# Patient Record
Sex: Female | Born: 1970
Health system: Southern US, Community
[De-identification: ages and names within clinical notes are randomized; demographics above are authoritative.]

## PROBLEM LIST (undated history)

## (undated) DIAGNOSIS — K219 Gastro-esophageal reflux disease without esophagitis: Secondary | ICD-10-CM

---

## 2006-05-22 ENCOUNTER — Emergency Department: Payer: Self-pay | Admitting: Emergency Medicine

## 2008-09-21 ENCOUNTER — Ambulatory Visit: Payer: Self-pay | Admitting: Ophthalmology

## 2008-10-19 ENCOUNTER — Encounter: Payer: Self-pay | Admitting: Obstetrics & Gynecology

## 2008-10-26 ENCOUNTER — Encounter: Payer: Self-pay | Admitting: Obstetrics and Gynecology

## 2008-10-31 ENCOUNTER — Ambulatory Visit: Payer: Self-pay

## 2008-11-13 ENCOUNTER — Ambulatory Visit: Payer: Self-pay

## 2008-11-27 ENCOUNTER — Encounter: Payer: Self-pay | Admitting: Obstetrics & Gynecology

## 2008-12-25 ENCOUNTER — Encounter: Payer: Self-pay | Admitting: Obstetrics and Gynecology

## 2009-01-16 ENCOUNTER — Encounter: Payer: Self-pay | Admitting: Pediatric Cardiology

## 2009-02-12 ENCOUNTER — Inpatient Hospital Stay: Payer: Self-pay

## 2009-03-01 ENCOUNTER — Encounter: Payer: Self-pay | Admitting: Maternal & Fetal Medicine

## 2009-03-15 ENCOUNTER — Encounter: Payer: Self-pay | Admitting: Obstetrics & Gynecology

## 2009-12-26 ENCOUNTER — Ambulatory Visit: Payer: Self-pay | Admitting: Internal Medicine

## 2011-06-05 ENCOUNTER — Ambulatory Visit: Payer: Self-pay | Admitting: Internal Medicine

## 2012-07-12 ENCOUNTER — Ambulatory Visit: Payer: Self-pay | Admitting: Internal Medicine

## 2013-01-17 DIAGNOSIS — M25579 Pain in unspecified ankle and joints of unspecified foot: Secondary | ICD-10-CM | POA: Insufficient documentation

## 2013-01-17 DIAGNOSIS — S93491A Sprain of other ligament of right ankle, initial encounter: Secondary | ICD-10-CM | POA: Insufficient documentation

## 2013-01-17 DIAGNOSIS — M19079 Primary osteoarthritis, unspecified ankle and foot: Secondary | ICD-10-CM | POA: Insufficient documentation

## 2013-06-20 DIAGNOSIS — K219 Gastro-esophageal reflux disease without esophagitis: Secondary | ICD-10-CM | POA: Insufficient documentation

## 2013-06-20 DIAGNOSIS — E282 Polycystic ovarian syndrome: Secondary | ICD-10-CM | POA: Insufficient documentation

## 2013-06-20 DIAGNOSIS — E785 Hyperlipidemia, unspecified: Secondary | ICD-10-CM | POA: Insufficient documentation

## 2013-06-20 DIAGNOSIS — I10 Essential (primary) hypertension: Secondary | ICD-10-CM | POA: Insufficient documentation

## 2013-10-21 ENCOUNTER — Ambulatory Visit: Payer: Self-pay | Admitting: Internal Medicine

## 2015-03-16 ENCOUNTER — Other Ambulatory Visit: Payer: Self-pay | Admitting: Internal Medicine

## 2015-03-16 DIAGNOSIS — Z1231 Encounter for screening mammogram for malignant neoplasm of breast: Secondary | ICD-10-CM

## 2015-05-01 ENCOUNTER — Ambulatory Visit
Admission: RE | Admit: 2015-05-01 | Discharge: 2015-05-01 | Disposition: A | Discharge status: Home / Self Care | Payer: 59 | Source: Ambulatory Visit | Attending: Internal Medicine | Admitting: Internal Medicine

## 2015-05-01 DIAGNOSIS — Z1231 Encounter for screening mammogram for malignant neoplasm of breast: Secondary | ICD-10-CM

## 2015-08-03 DIAGNOSIS — Z6841 Body Mass Index (BMI) 40.0 and over, adult: Secondary | ICD-10-CM | POA: Insufficient documentation

## 2016-02-20 DIAGNOSIS — Z79899 Other long term (current) drug therapy: Secondary | ICD-10-CM | POA: Diagnosis not present

## 2016-02-20 DIAGNOSIS — E7800 Pure hypercholesterolemia, unspecified: Secondary | ICD-10-CM | POA: Diagnosis not present

## 2016-02-20 DIAGNOSIS — E119 Type 2 diabetes mellitus without complications: Secondary | ICD-10-CM | POA: Diagnosis not present

## 2016-02-22 DIAGNOSIS — E119 Type 2 diabetes mellitus without complications: Secondary | ICD-10-CM | POA: Diagnosis not present

## 2016-02-22 DIAGNOSIS — Z0000 Encounter for general adult medical examination without abnormal findings: Secondary | ICD-10-CM | POA: Diagnosis not present

## 2016-02-22 DIAGNOSIS — I10 Essential (primary) hypertension: Secondary | ICD-10-CM | POA: Diagnosis not present

## 2016-08-04 ENCOUNTER — Other Ambulatory Visit: Payer: Self-pay | Admitting: Internal Medicine

## 2016-08-04 DIAGNOSIS — Z1231 Encounter for screening mammogram for malignant neoplasm of breast: Secondary | ICD-10-CM

## 2016-08-07 DIAGNOSIS — E7800 Pure hypercholesterolemia, unspecified: Secondary | ICD-10-CM | POA: Diagnosis not present

## 2016-08-07 DIAGNOSIS — Z1329 Encounter for screening for other suspected endocrine disorder: Secondary | ICD-10-CM | POA: Diagnosis not present

## 2016-08-07 DIAGNOSIS — Z79899 Other long term (current) drug therapy: Secondary | ICD-10-CM | POA: Diagnosis not present

## 2016-08-21 ENCOUNTER — Encounter: Payer: Self-pay | Admitting: Radiology

## 2016-08-21 ENCOUNTER — Ambulatory Visit
Admission: RE | Admit: 2016-08-21 | Discharge: 2016-08-21 | Disposition: A | Discharge status: Home / Self Care | Payer: 59 | Source: Ambulatory Visit | Attending: Internal Medicine | Admitting: Internal Medicine

## 2016-08-21 DIAGNOSIS — Z1231 Encounter for screening mammogram for malignant neoplasm of breast: Secondary | ICD-10-CM | POA: Diagnosis not present

## 2016-08-21 DIAGNOSIS — E7800 Pure hypercholesterolemia, unspecified: Secondary | ICD-10-CM | POA: Diagnosis not present

## 2017-01-29 DIAGNOSIS — E7800 Pure hypercholesterolemia, unspecified: Secondary | ICD-10-CM | POA: Diagnosis not present

## 2017-01-29 DIAGNOSIS — E119 Type 2 diabetes mellitus without complications: Secondary | ICD-10-CM | POA: Diagnosis not present

## 2017-01-29 DIAGNOSIS — Z79899 Other long term (current) drug therapy: Secondary | ICD-10-CM | POA: Diagnosis not present

## 2017-02-20 DIAGNOSIS — Z0000 Encounter for general adult medical examination without abnormal findings: Secondary | ICD-10-CM | POA: Diagnosis not present

## 2017-02-20 DIAGNOSIS — E7800 Pure hypercholesterolemia, unspecified: Secondary | ICD-10-CM | POA: Diagnosis not present

## 2017-05-14 DIAGNOSIS — E7800 Pure hypercholesterolemia, unspecified: Secondary | ICD-10-CM | POA: Diagnosis not present

## 2017-05-14 DIAGNOSIS — E119 Type 2 diabetes mellitus without complications: Secondary | ICD-10-CM | POA: Diagnosis not present

## 2017-05-14 DIAGNOSIS — Z79899 Other long term (current) drug therapy: Secondary | ICD-10-CM | POA: Diagnosis not present

## 2017-05-22 DIAGNOSIS — I10 Essential (primary) hypertension: Secondary | ICD-10-CM | POA: Diagnosis not present

## 2017-05-22 DIAGNOSIS — E119 Type 2 diabetes mellitus without complications: Secondary | ICD-10-CM | POA: Diagnosis not present

## 2017-07-23 ENCOUNTER — Other Ambulatory Visit: Payer: Self-pay | Admitting: Internal Medicine

## 2017-07-23 DIAGNOSIS — Z1231 Encounter for screening mammogram for malignant neoplasm of breast: Secondary | ICD-10-CM

## 2017-08-27 DIAGNOSIS — E7800 Pure hypercholesterolemia, unspecified: Secondary | ICD-10-CM | POA: Diagnosis not present

## 2017-08-27 DIAGNOSIS — Z79899 Other long term (current) drug therapy: Secondary | ICD-10-CM | POA: Diagnosis not present

## 2017-08-27 DIAGNOSIS — I10 Essential (primary) hypertension: Secondary | ICD-10-CM | POA: Diagnosis not present

## 2017-09-04 ENCOUNTER — Encounter: Payer: Self-pay | Admitting: Radiology

## 2017-09-04 ENCOUNTER — Ambulatory Visit
Admission: RE | Admit: 2017-09-04 | Discharge: 2017-09-04 | Disposition: A | Discharge status: Home / Self Care | Payer: 59 | Source: Ambulatory Visit | Attending: Internal Medicine | Admitting: Internal Medicine

## 2017-09-04 DIAGNOSIS — Z1231 Encounter for screening mammogram for malignant neoplasm of breast: Secondary | ICD-10-CM

## 2017-09-11 DIAGNOSIS — E7800 Pure hypercholesterolemia, unspecified: Secondary | ICD-10-CM | POA: Diagnosis not present

## 2017-09-11 DIAGNOSIS — E119 Type 2 diabetes mellitus without complications: Secondary | ICD-10-CM | POA: Diagnosis not present

## 2017-12-05 DIAGNOSIS — E119 Type 2 diabetes mellitus without complications: Secondary | ICD-10-CM | POA: Diagnosis not present

## 2017-12-05 DIAGNOSIS — H524 Presbyopia: Secondary | ICD-10-CM | POA: Diagnosis not present

## 2018-02-10 DIAGNOSIS — Z79899 Other long term (current) drug therapy: Secondary | ICD-10-CM | POA: Diagnosis not present

## 2018-02-10 DIAGNOSIS — E119 Type 2 diabetes mellitus without complications: Secondary | ICD-10-CM | POA: Diagnosis not present

## 2018-02-10 DIAGNOSIS — E7800 Pure hypercholesterolemia, unspecified: Secondary | ICD-10-CM | POA: Diagnosis not present

## 2018-02-19 DIAGNOSIS — E1165 Type 2 diabetes mellitus with hyperglycemia: Secondary | ICD-10-CM | POA: Diagnosis not present

## 2018-02-19 DIAGNOSIS — E039 Hypothyroidism, unspecified: Secondary | ICD-10-CM | POA: Diagnosis not present

## 2018-02-19 DIAGNOSIS — I10 Essential (primary) hypertension: Secondary | ICD-10-CM | POA: Diagnosis not present

## 2018-08-26 DIAGNOSIS — G720 Drug-induced myopathy: Secondary | ICD-10-CM | POA: Insufficient documentation

## 2018-09-13 ENCOUNTER — Other Ambulatory Visit: Payer: Self-pay | Admitting: Internal Medicine

## 2018-09-13 DIAGNOSIS — Z1231 Encounter for screening mammogram for malignant neoplasm of breast: Secondary | ICD-10-CM

## 2019-06-24 ENCOUNTER — Ambulatory Visit
Admission: RE | Admit: 2019-06-24 | Discharge: 2019-06-24 | Disposition: A | Discharge status: Home / Self Care | Payer: 59 | Source: Ambulatory Visit | Attending: Internal Medicine | Admitting: Internal Medicine

## 2019-06-24 DIAGNOSIS — Z1231 Encounter for screening mammogram for malignant neoplasm of breast: Secondary | ICD-10-CM | POA: Insufficient documentation

## 2019-06-28 ENCOUNTER — Other Ambulatory Visit: Payer: Self-pay | Admitting: Internal Medicine

## 2019-06-28 DIAGNOSIS — R928 Other abnormal and inconclusive findings on diagnostic imaging of breast: Secondary | ICD-10-CM

## 2019-06-28 DIAGNOSIS — N6320 Unspecified lump in the left breast, unspecified quadrant: Secondary | ICD-10-CM

## 2019-07-01 ENCOUNTER — Ambulatory Visit
Admission: RE | Admit: 2019-07-01 | Discharge: 2019-07-01 | Disposition: A | Discharge status: Home / Self Care | Payer: 59 | Source: Ambulatory Visit | Attending: Internal Medicine | Admitting: Internal Medicine

## 2019-07-01 DIAGNOSIS — N6320 Unspecified lump in the left breast, unspecified quadrant: Secondary | ICD-10-CM

## 2019-07-01 DIAGNOSIS — R928 Other abnormal and inconclusive findings on diagnostic imaging of breast: Secondary | ICD-10-CM | POA: Diagnosis not present

## 2019-12-19 ENCOUNTER — Other Ambulatory Visit: Payer: Self-pay

## 2019-12-19 ENCOUNTER — Encounter: Payer: Self-pay | Admitting: Gastroenterology

## 2019-12-19 ENCOUNTER — Ambulatory Visit (INDEPENDENT_AMBULATORY_CARE_PROVIDER_SITE_OTHER): Payer: 59 | Admitting: Gastroenterology

## 2019-12-19 VITALS — BP 139/92 | HR 105 | Temp 98.8°F | Ht 66.0 in | Wt 272.4 lb

## 2019-12-19 DIAGNOSIS — Z1211 Encounter for screening for malignant neoplasm of colon: Secondary | ICD-10-CM

## 2019-12-19 DIAGNOSIS — K219 Gastro-esophageal reflux disease without esophagitis: Secondary | ICD-10-CM

## 2019-12-19 MED ORDER — NA SULFATE-K SULFATE-MG SULF 17.5-3.13-1.6 GM/177ML PO SOLN
354.0000 mL | Freq: Once | ORAL | 0 refills | Status: AC
Start: 2019-12-19 — End: 2019-12-19

## 2019-12-19 NOTE — Progress Notes (Signed)
Cephas Darby, MD [address]  [place]  [place], [place] [address]  Main: [phone]  Fax: [phone]    Gastroenterology Consultation  Referring Provider:     Idelle Crouch, MD Primary Care Physician:  Idelle Crouch, MD Primary Gastroenterologist:  Dr. Cephas Darby Reason for Consultation:     Chronic GERD, colon cancer screening        HPI:   Sara Zamora is a 49 y.o. female referred by Dr. Doy Hutching, Leonie Douglas, MD  for consultation & management of chronic GERD. Patient has history of metabolic syndrome, history of chronic reflux, maintained on AcipHex once a day. Patient reports that she had upper endoscopy 15 years ago and was told that she has gastric polyps. She wants to make sure that if she needs any follow-up upper endoscopy. She does not have any GI concerns otherwise. She takes AcipHex 2 pills during flareups which are occasional. Her weight has been stable, limited physical activity due to sedentary lifestyle.  She does not smoke or drink alcohol  NSAIDs: None  Antiplts/Anticoagulants/Anti thrombotics: None  GI Procedures: EGD and flexible sigmoidoscopy 15 years ago She denies family history of GI malignancy  History reviewed. No pertinent past medical history.  History reviewed. No pertinent surgical history.  Current Outpatient Medications:  .  Dulaglutide 3 MG/0.5ML SOPN, Inject into the skin., Disp: , Rfl:  .  glimepiride (AMARYL) 4 MG tablet, Take 4 mg by mouth 2 (two) times daily., Disp: , Rfl:  .  levothyroxine (SYNTHROID) 50 MCG tablet, TAKE 1 TABLET BY MOUTH ONCE DAILY TAKE ON AN EMPTY  STOMACH WITH A GLASS OF  WATER AT LEAST 30 TO 60  MINUTES BEFORE BREAKFAST, Disp: , Rfl:  .  losartan (COZAAR) 100 MG tablet, Take 1 tablet by mouth daily., Disp: , Rfl:  .  metFORMIN (GLUCOPHAGE) 1000 MG tablet, Take 1 tablet by mouth 2 (two) times daily with a meal., Disp: , Rfl:  .  RABEprazole (ACIPHEX) 20 MG tablet, Take 1 tablet by mouth  daily., Disp: , Rfl:  .  Na Sulfate-K Sulfate-Mg Sulf 17.5-3.13-1.6 GM/177ML SOLN, Take 354 mLs by mouth once for 1 dose., Disp: 354 mL, Rfl: 0   Family History  Problem Relation Age of Onset  . Mesothelioma Father      Social History   Tobacco Use  . Smoking status: Never Smoker  . Smokeless tobacco: Never Used  Vaping Use  . Vaping Use: Never used  Substance Use Topics  . Alcohol use: Not Currently  . Drug use: Never    Allergies as of 12/19/2019 - Review Complete 12/19/2019  Allergen Reaction Noted  . Atorvastatin Other (See Comments) 06/20/2013  . Hydrochlorothiazide w-triamterene Other (See Comments) 06/20/2013  . Caffeine Palpitations 03/11/2013    Review of Systems:    All systems reviewed and negative except where noted in HPI.   Physical Exam:  BP (!) 139/92 (BP Location: Left Arm, Patient Position: Sitting, Cuff Size: Normal)   Pulse (!) 105   Temp 98.8 F (37.1 C) (Oral)   Ht 5\' 6"  (1.676 m)   Wt 272 lb 6 oz (123.5 kg)   BMI 43.96 kg/m  No LMP recorded.  General:   Alert,  Well-developed, well-nourished, pleasant and cooperative in NAD Head:  Normocephalic and atraumatic. Eyes:  Sclera clear, no icterus.   Conjunctiva pink. Ears:  Normal auditory acuity. Nose:  No deformity, discharge, or lesions. Mouth:  No deformity or lesions,oropharynx pink &  moist. Neck:  Supple; no masses or thyromegaly. Lungs:  Respirations even and unlabored.  Clear throughout to auscultation.   No wheezes, crackles, or rhonchi. No acute distress. Heart:  Regular rate and rhythm; no murmurs, clicks, rubs, or gallops. Abdomen:  Normal bowel sounds. Soft, non-tender and non-distended without masses, hepatosplenomegaly or hernias noted.  No guarding or rebound tenderness.   Rectal: Not performed Msk:  Symmetrical without gross deformities. Good, equal movement & strength bilaterally. Pulses:  Normal pulses noted. Extremities:  No clubbing or edema.  No cyanosis. Neurologic:   Alert and oriented x3;  grossly normal neurologically. Skin:  Intact without significant lesions or rashes. No jaundice. Psych:  Alert and cooperative. Normal mood and affect.  Imaging Studies: None  Assessment and Plan:   Sara Zamora is a 49 y.o. female with metabolic syndrome, BMI 44.58 is seen in consultation for chronic GERD  Chronic GERD Recommend EGD for Barrett's screening Continue AcipHex 20 mg daily Discussed about antireflux lifestyle  Colon cancer screening, average risk Recommend screening colonoscopy and patient is agreeable   Follow up as needed   Cephas Darby, MD

## 2020-02-16 ENCOUNTER — Other Ambulatory Visit
Admission: RE | Admit: 2020-02-16 | Discharge: 2020-02-16 | Disposition: A | Discharge status: Home / Self Care | Payer: 59 | Source: Ambulatory Visit | Attending: Gastroenterology | Admitting: Gastroenterology

## 2020-02-16 ENCOUNTER — Other Ambulatory Visit: Payer: Self-pay

## 2020-02-16 DIAGNOSIS — Z20822 Contact with and (suspected) exposure to covid-19: Secondary | ICD-10-CM | POA: Diagnosis not present

## 2020-02-16 DIAGNOSIS — Z01812 Encounter for preprocedural laboratory examination: Secondary | ICD-10-CM | POA: Insufficient documentation

## 2020-02-16 LAB — SARS CORONAVIRUS 2 (TAT 6-24 HRS): SARS Coronavirus 2: NEGATIVE

## 2020-02-17 ENCOUNTER — Encounter: Payer: Self-pay | Admitting: Gastroenterology

## 2020-02-20 ENCOUNTER — Ambulatory Visit: Payer: 59 | Admitting: Anesthesiology

## 2020-02-20 ENCOUNTER — Encounter
Admission: RE | Disposition: A | Discharge status: Home / Self Care | Payer: Self-pay | Source: Home / Self Care | Attending: Gastroenterology

## 2020-02-20 ENCOUNTER — Ambulatory Visit
Admission: RE | Admit: 2020-02-20 | Discharge: 2020-02-20 | Disposition: A | Discharge status: Home / Self Care | Payer: 59 | Attending: Gastroenterology | Admitting: Gastroenterology

## 2020-02-20 DIAGNOSIS — Z1211 Encounter for screening for malignant neoplasm of colon: Secondary | ICD-10-CM | POA: Diagnosis present

## 2020-02-20 DIAGNOSIS — Z1389 Encounter for screening for other disorder: Secondary | ICD-10-CM | POA: Insufficient documentation

## 2020-02-20 DIAGNOSIS — K317 Polyp of stomach and duodenum: Secondary | ICD-10-CM | POA: Insufficient documentation

## 2020-02-20 DIAGNOSIS — K644 Residual hemorrhoidal skin tags: Secondary | ICD-10-CM | POA: Diagnosis not present

## 2020-02-20 DIAGNOSIS — K5730 Diverticulosis of large intestine without perforation or abscess without bleeding: Secondary | ICD-10-CM | POA: Diagnosis not present

## 2020-02-20 DIAGNOSIS — Z79899 Other long term (current) drug therapy: Secondary | ICD-10-CM | POA: Insufficient documentation

## 2020-02-20 DIAGNOSIS — K219 Gastro-esophageal reflux disease without esophagitis: Secondary | ICD-10-CM | POA: Insufficient documentation

## 2020-02-20 DIAGNOSIS — K449 Diaphragmatic hernia without obstruction or gangrene: Secondary | ICD-10-CM | POA: Insufficient documentation

## 2020-02-20 DIAGNOSIS — Z7984 Long term (current) use of oral hypoglycemic drugs: Secondary | ICD-10-CM | POA: Insufficient documentation

## 2020-02-20 DIAGNOSIS — Z91018 Allergy to other foods: Secondary | ICD-10-CM | POA: Insufficient documentation

## 2020-02-20 DIAGNOSIS — Z888 Allergy status to other drugs, medicaments and biological substances status: Secondary | ICD-10-CM | POA: Insufficient documentation

## 2020-02-20 DIAGNOSIS — D120 Benign neoplasm of cecum: Secondary | ICD-10-CM | POA: Insufficient documentation

## 2020-02-20 HISTORY — PX: ESOPHAGOGASTRODUODENOSCOPY (EGD) WITH PROPOFOL: SHX5813

## 2020-02-20 HISTORY — DX: Gastro-esophageal reflux disease without esophagitis: K21.9

## 2020-02-20 HISTORY — PX: COLONOSCOPY WITH PROPOFOL: SHX5780

## 2020-02-20 LAB — GLUCOSE, CAPILLARY: Glucose-Capillary: 144 mg/dL — ABNORMAL HIGH (ref 70–99)

## 2020-02-20 LAB — POCT PREGNANCY, URINE: Preg Test, Ur: NEGATIVE

## 2020-02-20 SURGERY — COLONOSCOPY WITH PROPOFOL
Anesthesia: General

## 2020-02-20 MED ORDER — MIDAZOLAM HCL 2 MG/2ML IJ SOLN
INTRAMUSCULAR | Status: AC
  Filled 2020-02-20: qty 2

## 2020-02-20 MED ORDER — EPHEDRINE SULFATE 50 MG/ML IJ SOLN
INTRAMUSCULAR | Status: DC | PRN
  Administered 2020-02-20 (×2): 5 mg via INTRAVENOUS

## 2020-02-20 MED ORDER — PROPOFOL 500 MG/50ML IV EMUL
INTRAVENOUS | Status: AC
  Filled 2020-02-20: qty 50

## 2020-02-20 MED ORDER — LIDOCAINE HCL (CARDIAC) PF 100 MG/5ML IV SOSY
PREFILLED_SYRINGE | INTRAVENOUS | Status: DC | PRN
  Administered 2020-02-20: 80 mg via INTRAVENOUS

## 2020-02-20 MED ORDER — PROPOFOL 10 MG/ML IV BOLUS
INTRAVENOUS | Status: DC | PRN
  Administered 2020-02-20: 20 mg via INTRAVENOUS
  Administered 2020-02-20: 50 mg via INTRAVENOUS

## 2020-02-20 MED ORDER — SODIUM CHLORIDE 0.9 % IV SOLN
INTRAVENOUS | Status: DC
  Administered 2020-02-20: 20 mL/h via INTRAVENOUS

## 2020-02-20 MED ORDER — PROPOFOL 10 MG/ML IV BOLUS
INTRAVENOUS | Status: AC
  Filled 2020-02-20: qty 20

## 2020-02-20 MED ORDER — PROPOFOL 500 MG/50ML IV EMUL
INTRAVENOUS | Status: DC | PRN
  Administered 2020-02-20: 180 ug/kg/min via INTRAVENOUS

## 2020-02-20 NOTE — Anesthesia Postprocedure Evaluation (Signed)
Anesthesia Post Note  Patient: Sara Zamora  Procedure(s) Performed: COLONOSCOPY WITH PROPOFOL (N/A ) ESOPHAGOGASTRODUODENOSCOPY (EGD) WITH PROPOFOL (N/A )  Patient location during evaluation: Endoscopy Anesthesia Type: General Level of consciousness: awake and alert Pain management: pain level controlled Vital Signs Assessment: post-procedure vital signs reviewed and stable Respiratory status: spontaneous breathing, nonlabored ventilation, respiratory function stable and patient connected to nasal cannula oxygen Cardiovascular status: blood pressure returned to baseline and stable Postop Assessment: no apparent nausea or vomiting Anesthetic complications: no   No complications documented.   Last Vitals:  Vitals:   02/20/20 0915 02/20/20 0925  BP: 125/68 100/60  Pulse: 88 81  Resp: 18 17  Temp:    SpO2: 98% 100%    Last Pain:  Vitals:   02/20/20 0915  TempSrc:   PainSc: 0-No pain                 Arita Miss

## 2020-02-20 NOTE — Op Note (Signed)
[facility] Gastroenterology Patient Name: Sara Zamora Procedure Date: 02/20/2020 8:15 AM MRN: [id number] Account #: [id number] Date of Birth: 1970/11/16 Admit Type: Outpatient Age: 50 Room: [facility] ENDO ROOM 1 Gender: Female Note Status: Finalized Procedure:             Upper GI endoscopy Indications:           Screening for Barrett's esophagus, Screening for                         Barrett's esophagus in patient at risk for this                         condition Providers:             Lin Landsman MD, MD Medicines:             General Anesthesia Complications:         No immediate complications. Estimated blood loss: None. Procedure:             Pre-Anesthesia Assessment:                        - Prior to the procedure, a History and Physical was                         performed, and patient medications and allergies were                         reviewed. The patient is competent. The risks and                         benefits of the procedure and the sedation options and                         risks were discussed with the patient. All questions                         were answered and informed consent was obtained.                         Patient identification and proposed procedure were                         verified by the physician, the nurse, the                         anesthesiologist, the anesthetist and the technician                         in the pre-procedure area in the procedure room in the                         endoscopy suite. Mental Status Examination: alert and                         oriented. Airway Examination: normal oropharyngeal                         airway and neck mobility. Respiratory Examination:  clear to auscultation. CV Examination: normal.                         Prophylactic Antibiotics: The patient does not require                         prophylactic antibiotics. Prior Anticoagulants: The                          patient has taken no previous anticoagulant or                         antiplatelet agents. ASA Grade Assessment: III - A                         patient with severe systemic disease. After reviewing                         the risks and benefits, the patient was deemed in                         satisfactory condition to undergo the procedure. The                         anesthesia plan was to use general anesthesia.                         Immediately prior to administration of medications,                         the patient was re-assessed for adequacy to receive                         sedatives. The heart rate, respiratory rate, oxygen                         saturations, blood pressure, adequacy of pulmonary                         ventilation, and response to care were monitored                         throughout the procedure. The physical status of the                         patient was re-assessed after the procedure.                        After obtaining informed consent, the endoscope was                         passed under direct vision. Throughout the procedure,                         the patient's blood pressure, pulse, and oxygen                         saturations were monitored continuously. The Endoscope  was introduced through the mouth, and advanced to the                         second part of duodenum. The upper GI endoscopy was                         accomplished without difficulty. The patient tolerated                         the procedure well. Findings:      The duodenal bulb and second portion of the duodenum were normal.      A single 10 mm sessile polyp with no bleeding and no stigmata of recent       bleeding was found in the gastric antrum. Biopsies were taken with a       cold forceps for histology.      A small hiatal hernia was present.      Esophagogastric landmarks were identified: the gastroesophageal  junction       was found at 35 cm from the incisors.      The gastroesophageal junction and examined esophagus were normal. Impression:            - Normal duodenal bulb and second portion of the                         duodenum.                        - A single gastric polyp. Biopsied.                        - Small hiatal hernia.                        - Esophagogastric landmarks identified.                        - Normal gastroesophageal junction and esophagus. Recommendation:        - Await pathology results.                        - Follow an antireflux regimen.                        - Continue present medications.                        - Proceed with colonoscopy as scheduled                        See colonoscopy report Procedure Code(s):     --- Professional ---                        [phone], Esophagogastroduodenoscopy, flexible,                         transoral; with biopsy, single or multiple Diagnosis Code(s):     --- Professional ---                        K31.7, Polyp of stomach and duodenum  K44.9, Diaphragmatic hernia without obstruction or                         gangrene                        Z13.810, Encounter for screening for upper                         gastrointestinal disorder CPT copyright 2019 American Medical Association. All rights reserved. The codes documented in this report are preliminary and upon coder review may  be revised to meet current compliance requirements. Dr. Ulyess Mort Lin Landsman MD, MD 02/20/2020 8:39:17 AM This report has been signed electronically. Number of Addenda: 0 Note Initiated On: 02/20/2020 8:15 AM Estimated Blood Loss:  Estimated blood loss: none.      [facility]

## 2020-02-20 NOTE — Anesthesia Procedure Notes (Signed)
Date/Time: 02/20/2020 8:10 AM Performed by: Allean Found, CRNA Pre-anesthesia Checklist: Patient identified, Emergency Drugs available, Suction available, Patient being monitored and Timeout performed Patient Re-evaluated:Patient Re-evaluated prior to induction Oxygen Delivery Method: Nasal cannula Placement Confirmation: positive ETCO2

## 2020-02-20 NOTE — Transfer of Care (Signed)
Immediate Anesthesia Transfer of Care Note  Patient: Sara Zamora  Procedure(s) Performed: COLONOSCOPY WITH PROPOFOL (N/A ) ESOPHAGOGASTRODUODENOSCOPY (EGD) WITH PROPOFOL (N/A )  Patient Location: PACU  Anesthesia Type:General  Level of Consciousness: sedated  Airway & Oxygen Therapy: Patient Spontanous Breathing and Patient connected to nasal cannula oxygen  Post-op Assessment: Report given to RN and Post -op Vital signs reviewed and stable  Post vital signs: Reviewed and stable  Last Vitals:  Vitals Value Taken Time  BP 110/59 02/20/20 0905  Temp 36.1 C 02/20/20 0905  Pulse 97 02/20/20 0907  Resp 16 02/20/20 0907  SpO2 96 % 02/20/20 0907  Vitals shown include unvalidated device data.  Last Pain:  Vitals:   02/20/20 0905  TempSrc: Temporal  PainSc:          Complications: No complications documented.

## 2020-02-20 NOTE — Op Note (Signed)
[facility] Gastroenterology Patient Name: Sara Zamora Procedure Date: 02/20/2020 8:15 AM MRN: [id number] Account #: [id number] Date of Birth: [birth year] Admit Type: Outpatient Age: 50 Room: [facility] ENDO ROOM 1 Gender: Female Note Status: Finalized Procedure:             Colonoscopy Indications:           Screening for colorectal malignant neoplasm Providers:             Lin Landsman MD, MD Medicines:             General Anesthesia Complications:         No immediate complications. Estimated blood loss: None. Procedure:             Pre-Anesthesia Assessment:                        - Prior to the procedure, a History and Physical was                         performed, and patient medications and allergies were                         reviewed. The patient is competent. The risks and                         benefits of the procedure and the sedation options and                         risks were discussed with the patient. All questions                         were answered and informed consent was obtained.                         Patient identification and proposed procedure were                         verified by the physician, the nurse, the                         anesthesiologist, the anesthetist and the technician                         in the pre-procedure area in the procedure room in the                         endoscopy suite. Mental Status Examination: alert and                         oriented. Airway Examination: normal oropharyngeal                         airway and neck mobility. Respiratory Examination:                         clear to auscultation. CV Examination: normal.                         Prophylactic Antibiotics: The patient does not require  prophylactic antibiotics. Prior Anticoagulants: The                         patient has taken no previous anticoagulant or                         antiplatelet agents. ASA  Grade Assessment: III - A                         patient with severe systemic disease. After reviewing                         the risks and benefits, the patient was deemed in                         satisfactory condition to undergo the procedure. The                         anesthesia plan was to use general anesthesia.                         Immediately prior to administration of medications,                         the patient was re-assessed for adequacy to receive                         sedatives. The heart rate, respiratory rate, oxygen                         saturations, blood pressure, adequacy of pulmonary                         ventilation, and response to care were monitored                         throughout the procedure. The physical status of the                         patient was re-assessed after the procedure.                        After obtaining informed consent, the colonoscope was                         passed under direct vision. Throughout the procedure,                         the patient's blood pressure, pulse, and oxygen                         saturations were monitored continuously. The                         Colonoscope was introduced through the anus and                         advanced to the the cecum, identified by appendiceal  orifice and ileocecal valve. The colonoscopy was                         performed with moderate difficulty due to significant                         looping and the patient's body habitus. Successful                         completion of the procedure was aided by applying                         abdominal pressure. The patient tolerated the                         procedure well. The quality of the bowel preparation                         was evaluated using the BBPS [facility] Bowel Preparation                         Scale) with scores of: Right Colon = 3, Transverse                         Colon  = 3 and Left Colon = 3 (entire mucosa seen well                         with no residual staining, small fragments of stool or                         opaque liquid). The total BBPS score equals 9. Findings:      Skin tags were found on perianal exam.      Multiple diverticula were found in the sigmoid colon.      The retroflexed view of the distal rectum and anal verge was normal and       showed no anal or rectal abnormalities.      A 5 mm polyp was found in the cecum. The polyp was sessile. The polyp       was removed with a cold snare. Resection and retrieval were complete.      The exam was otherwise without abnormality. Impression:            - Perianal skin tags found on perianal exam.                        - Diverticulosis in the sigmoid colon.                        - The distal rectum and anal verge are normal on                         retroflexion view.                        - One 5 mm polyp in the cecum, removed with a cold  snare. Resected and retrieved.                        - The examination was otherwise normal. Recommendation:        - Discharge patient to home (with escort).                        - Resume previous diet today.                        - Continue present medications.                        - Await pathology results.                        - Repeat colonoscopy in 7 years for surveillance. Procedure Code(s):     --- Professional ---                        [phone], Colonoscopy, flexible; with removal of                         tumor(s), polyp(s), or other lesion(s) by snare                         technique Diagnosis Code(s):     --- Professional ---                        Z12.11, Encounter for screening for malignant neoplasm                         of colon                        K63.5, Polyp of colon                        K57.30, Diverticulosis of large intestine without                         perforation or abscess without  bleeding                        K64.4, Residual hemorrhoidal skin tags CPT copyright 2019 American Medical Association. All rights reserved. The codes documented in this report are preliminary and upon coder review may  be revised to meet current compliance requirements. Dr. Ulyess Mort Lin Landsman MD, MD 02/20/2020 8:58:07 AM This report has been signed electronically. Number of Addenda: 0 Note Initiated On: 02/20/2020 8:15 AM Scope Withdrawal Time: 0 hours 8 minutes 48 seconds  Total Procedure Duration: 0 hours 14 minutes 24 seconds  Estimated Blood Loss:  Estimated blood loss: none.      [facility]

## 2020-02-20 NOTE — Anesthesia Preprocedure Evaluation (Signed)
Anesthesia Evaluation  Patient identified by MRN, date of birth, ID band Patient awake    Reviewed: Allergy & Precautions, NPO status , Patient's Chart, lab work & pertinent test results  History of Anesthesia Complications Negative for: history of anesthetic complications  Airway Mallampati: II  TM Distance: >3 FB Neck ROM: Full    Dental no notable dental hx. (+) Teeth Intact   Pulmonary neg pulmonary ROS, neg sleep apnea, neg COPD, Patient abstained from smoking.Not current smoker,    Pulmonary exam normal breath sounds clear to auscultation       Cardiovascular Exercise Tolerance: Good METShypertension, (-) CAD and (-) Past MI (-) dysrhythmias  Rhythm:Regular Rate:Normal - Systolic murmurs    Neuro/Psych negative neurological ROS  negative psych ROS   GI/Hepatic GERD  Medicated,(+)     (-) substance abuse  ,   Endo/Other  diabetesHypothyroidism Morbid obesity  Renal/GU negative Renal ROS     Musculoskeletal   Abdominal (+) + obese,   Peds  Hematology   Anesthesia Other Findings Past Medical History: No date: GERD (gastroesophageal reflux disease)  Reproductive/Obstetrics                             Anesthesia Physical Anesthesia Plan  ASA: III  Anesthesia Plan: General   Post-op Pain Management:    Induction: Intravenous  PONV Risk Score and Plan: 3 and Ondansetron, Propofol infusion and TIVA  Airway Management Planned: Nasal Cannula  Additional Equipment: None  Intra-op Plan:   Post-operative Plan:   Informed Consent: I have reviewed the patients History and Physical, chart, labs and discussed the procedure including the risks, benefits and alternatives for the proposed anesthesia with the patient or authorized representative who has indicated his/her understanding and acceptance.     Dental advisory given  Plan Discussed with: CRNA and Surgeon  Anesthesia Plan  Comments: (Discussed risks of anesthesia with patient, including possibility of difficulty with spontaneous ventilation under anesthesia necessitating airway intervention, PONV, and rare risks such as cardiac or respiratory or neurological events. Patient understands. Patient informed about increased incidence of above perioperative risk due to high BMI. Patient understands. )        Anesthesia Quick Evaluation

## 2020-02-20 NOTE — H&P (Signed)
Cephas Darby, MD [address]  [place]  [place], [place] [address]  Main: [phone]  Fax: [phone] Pager: [phone]  Primary Care Physician:  Idelle Crouch, MD Primary Gastroenterologist:  Dr. Cephas Darby  Pre-Procedure History & Physical: HPI:  Sara Zamora is a 50 y.o. female is here for an endoscopy and colonoscopy.   Past Medical History:  Diagnosis Date  . GERD (gastroesophageal reflux disease)     No past surgical history on file.  Prior to Admission medications   Medication Sig Start Date End Date Taking? Authorizing Provider  Dulaglutide 3 MG/0.5ML SOPN Inject into the skin. 11/15/19  Yes [provider]  glimepiride (AMARYL) 4 MG tablet Take 4 mg by mouth 2 (two) times daily. 10/21/19  Yes [provider]  levothyroxine (SYNTHROID) 50 MCG tablet TAKE 1 TABLET BY MOUTH ONCE DAILY TAKE ON AN EMPTY  STOMACH WITH A GLASS OF  WATER AT LEAST 30 TO 60  MINUTES BEFORE BREAKFAST 10/05/19  Yes [provider]  losartan (COZAAR) 100 MG tablet Take 1 tablet by mouth daily. 10/05/19  Yes [provider]  metFORMIN (GLUCOPHAGE) 1000 MG tablet Take 1 tablet by mouth 2 (two) times daily with a meal. 08/01/19  Yes [provider]  RABEprazole (ACIPHEX) 20 MG tablet Take 1 tablet by mouth daily. 10/27/19  Yes [provider]    Allergies as of 12/19/2019 - Review Complete 12/19/2019  Allergen Reaction Noted  . Atorvastatin Other (See Comments) 06/20/2013  . Hydrochlorothiazide w-triamterene Other (See Comments) 06/20/2013  . Caffeine Palpitations 03/11/2013    Family History  Problem Relation Age of Onset  . Mesothelioma Father     Social History   Socioeconomic History  . Marital status: Married    Spouse name: Not on file  . Number of children: Not on file  . Years of education: Not on file  . Highest education level: Not on file  Occupational History  . Not on file  Tobacco Use  . Smoking  status: Never Smoker  . Smokeless tobacco: Never Used  Vaping Use  . Vaping Use: Never used  Substance and Sexual Activity  . Alcohol use: Not Currently  . Drug use: Never  . Sexual activity: Not on file  Other Topics Concern  . Not on file  Social History Narrative  . Not on file   Social Determinants of Health   Financial Resource Strain: Not on file  Food Insecurity: Not on file  Transportation Needs: Not on file  Physical Activity: Not on file  Stress: Not on file  Social Connections: Not on file  Intimate Partner Violence: Not on file    Review of Systems: See HPI, otherwise negative ROS  Physical Exam: BP (!) 154/99   Pulse 94   Temp (!) 96.9 F (36.1 C) (Temporal)   Resp 20   Ht 5\' 6"  (1.676 m)   Wt 122.5 kg   SpO2 99%   BMI 43.58 kg/m  General:   Alert,  pleasant and cooperative in NAD Head:  Normocephalic and atraumatic. Neck:  Supple; no masses or thyromegaly. Lungs:  Clear throughout to auscultation.    Heart:  Regular rate and rhythm. Abdomen:  Soft, nontender and nondistended. Normal bowel sounds, without guarding, and without rebound.   Neurologic:  Alert and  oriented x4;  grossly normal neurologically.  Impression/Plan: Sara Zamora is here for an endoscopy and colonoscopy to be performed for chronic gerd and colon cancer screening  Risks, benefits, limitations, and alternatives regarding  endoscopy and colonoscopy have been reviewed with the patient.  Questions have been answered.  All parties agreeable.   Sherri Sear, MD  02/20/2020, 8:13 AM

## 2020-02-21 ENCOUNTER — Encounter: Payer: Self-pay | Admitting: Gastroenterology

## 2020-02-21 LAB — SURGICAL PATHOLOGY

## 2020-03-08 ENCOUNTER — Other Ambulatory Visit: Payer: Self-pay | Admitting: Internal Medicine

## 2020-03-08 DIAGNOSIS — Z1231 Encounter for screening mammogram for malignant neoplasm of breast: Secondary | ICD-10-CM

## 2020-07-26 ENCOUNTER — Other Ambulatory Visit: Payer: Self-pay

## 2020-07-26 ENCOUNTER — Ambulatory Visit
Admission: RE | Admit: 2020-07-26 | Discharge: 2020-07-26 | Disposition: A | Discharge status: Home / Self Care | Payer: 59 | Source: Ambulatory Visit | Attending: Internal Medicine | Admitting: Internal Medicine

## 2020-07-26 DIAGNOSIS — Z1231 Encounter for screening mammogram for malignant neoplasm of breast: Secondary | ICD-10-CM

## 2021-07-15 ENCOUNTER — Other Ambulatory Visit: Payer: Self-pay | Admitting: Internal Medicine

## 2021-07-15 DIAGNOSIS — Z1231 Encounter for screening mammogram for malignant neoplasm of breast: Secondary | ICD-10-CM

## 2021-08-08 ENCOUNTER — Ambulatory Visit
Admission: RE | Admit: 2021-08-08 | Discharge: 2021-08-08 | Disposition: A | Discharge status: Home / Self Care | Payer: 59 | Source: Ambulatory Visit | Attending: Internal Medicine | Admitting: Internal Medicine

## 2021-08-08 DIAGNOSIS — Z1231 Encounter for screening mammogram for malignant neoplasm of breast: Secondary | ICD-10-CM | POA: Diagnosis present

## 2021-10-10 IMAGING — MG MM DIGITAL DIAGNOSTIC UNILAT*L* W/ TOMO W/ CAD
6 series · 6 of 18 positions shown · non-contrast
Comparison: Previous exam(s).

CLINICAL DATA: Screening recall for a possible left breast mass.

EXAM:
DIGITAL DIAGNOSTIC UNILATERAL LEFT MAMMOGRAM WITH TOMO AND CAD

[L MLO synth-2D (1 of 2)]
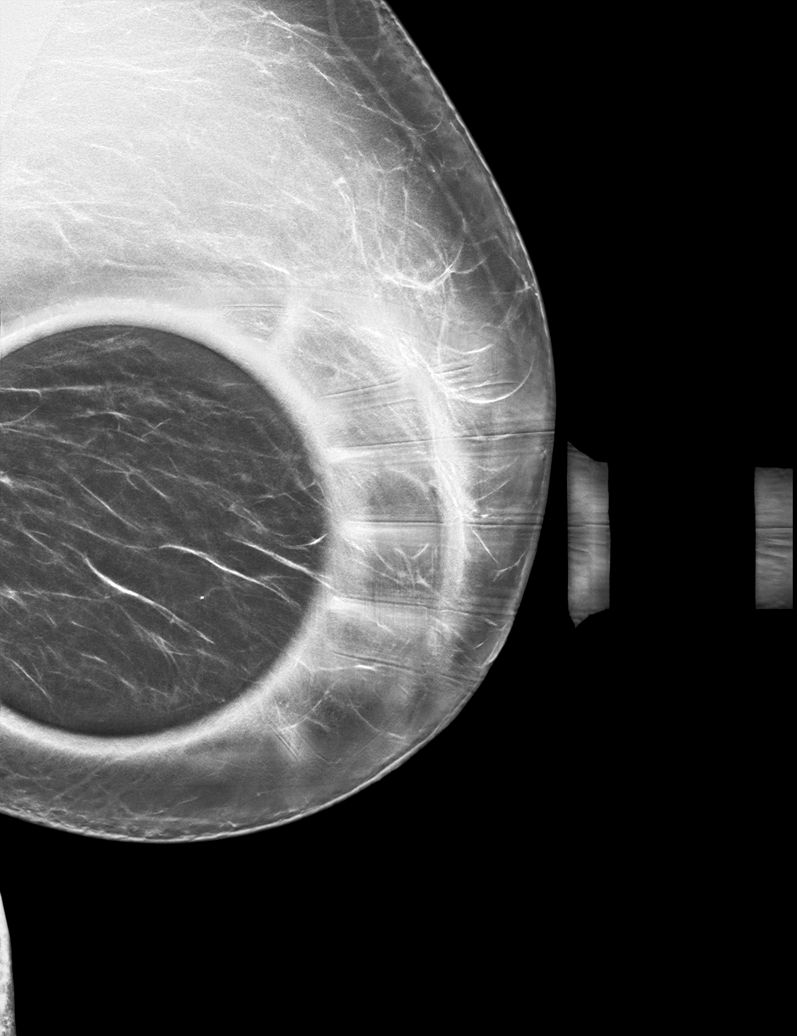

[L ML synth-2D]
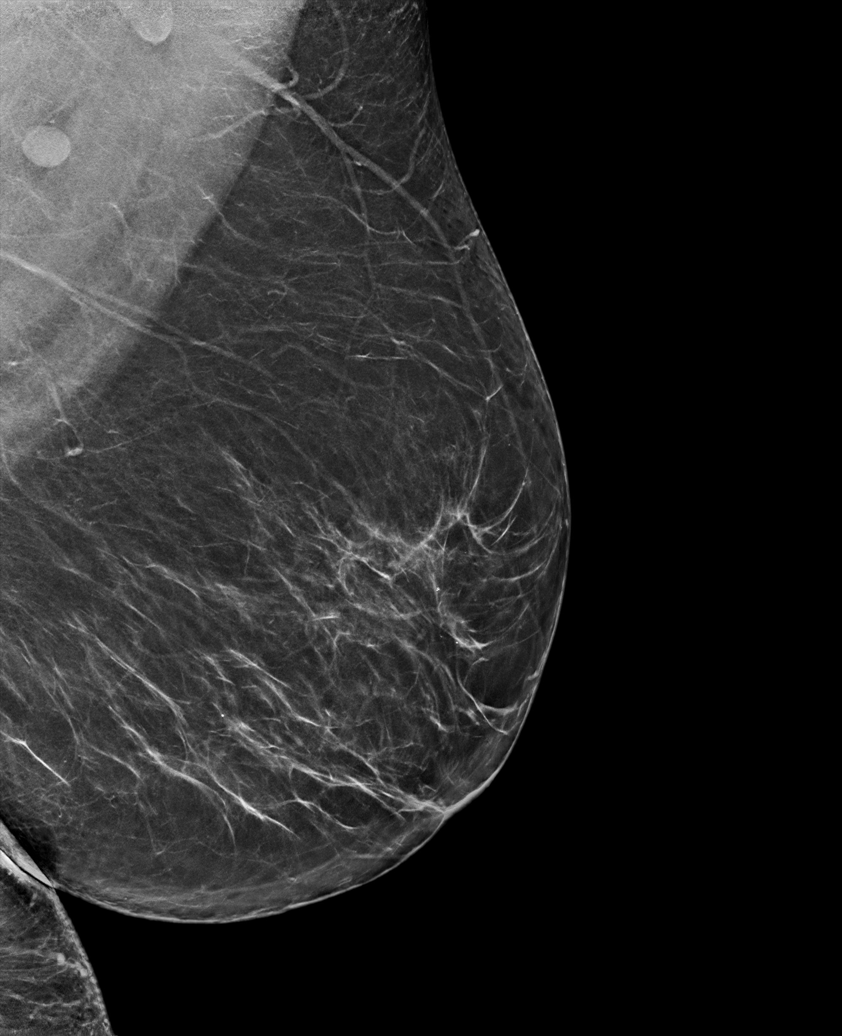

[L MLO synth-2D (2 of 2)]
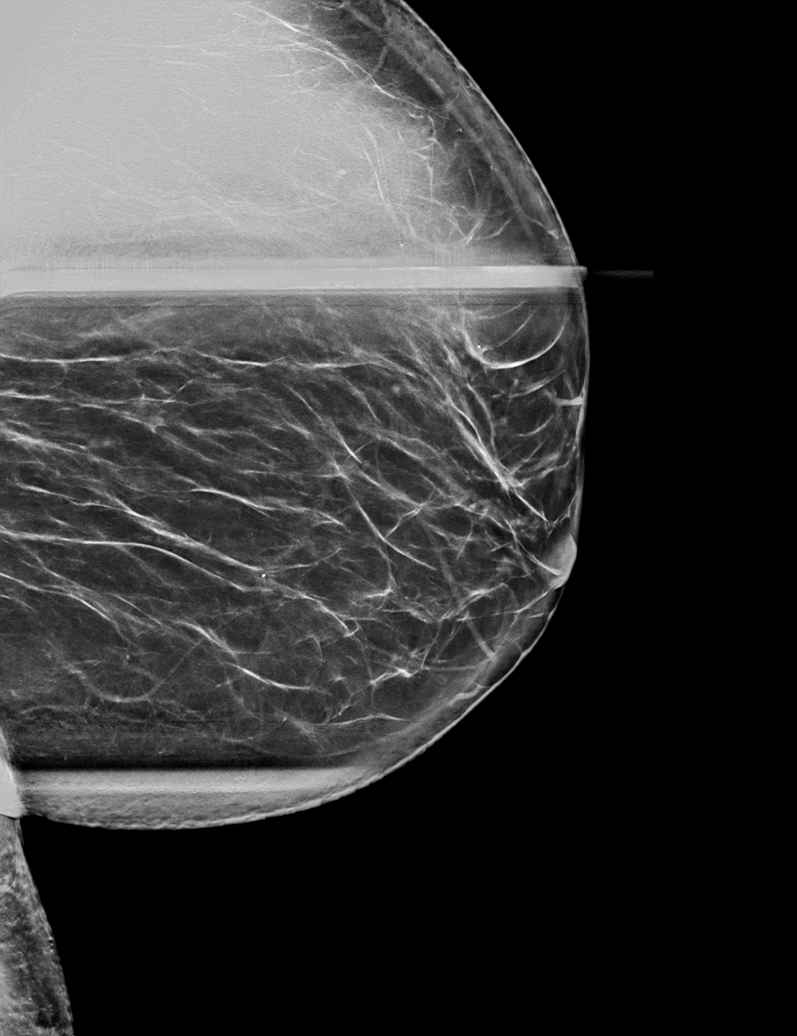

[L MLO tomo (1 of 2) · tomo slice 41/81.0]
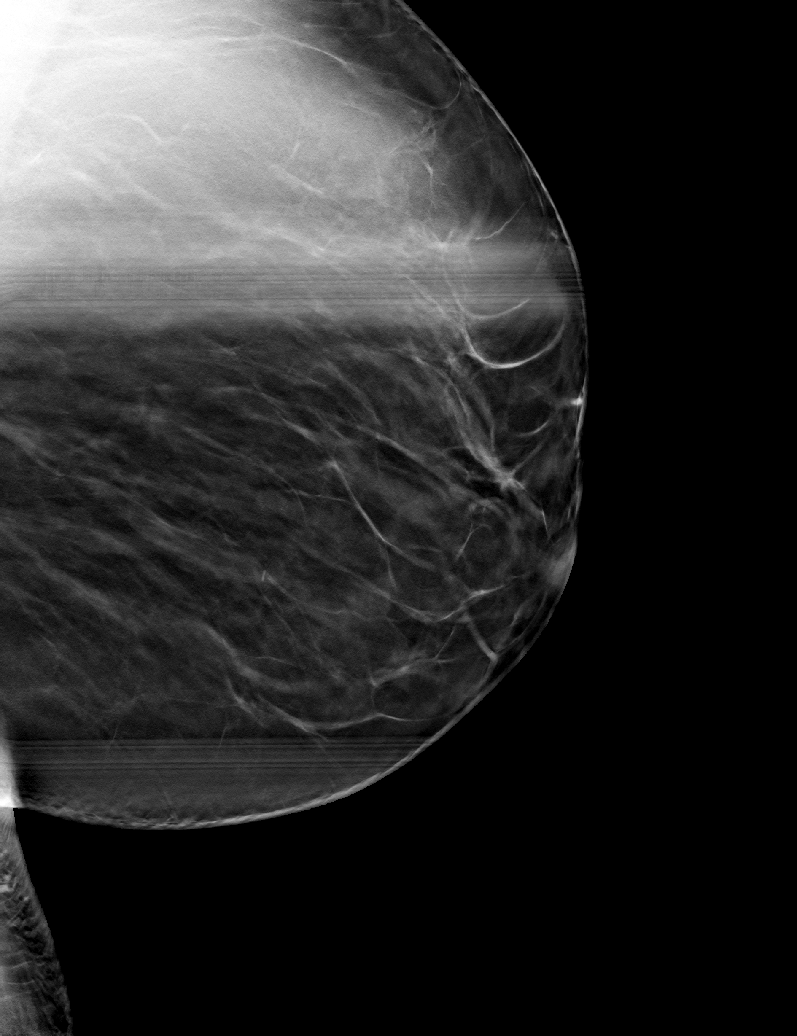

[L ML tomo · tomo slice 40/79.0]
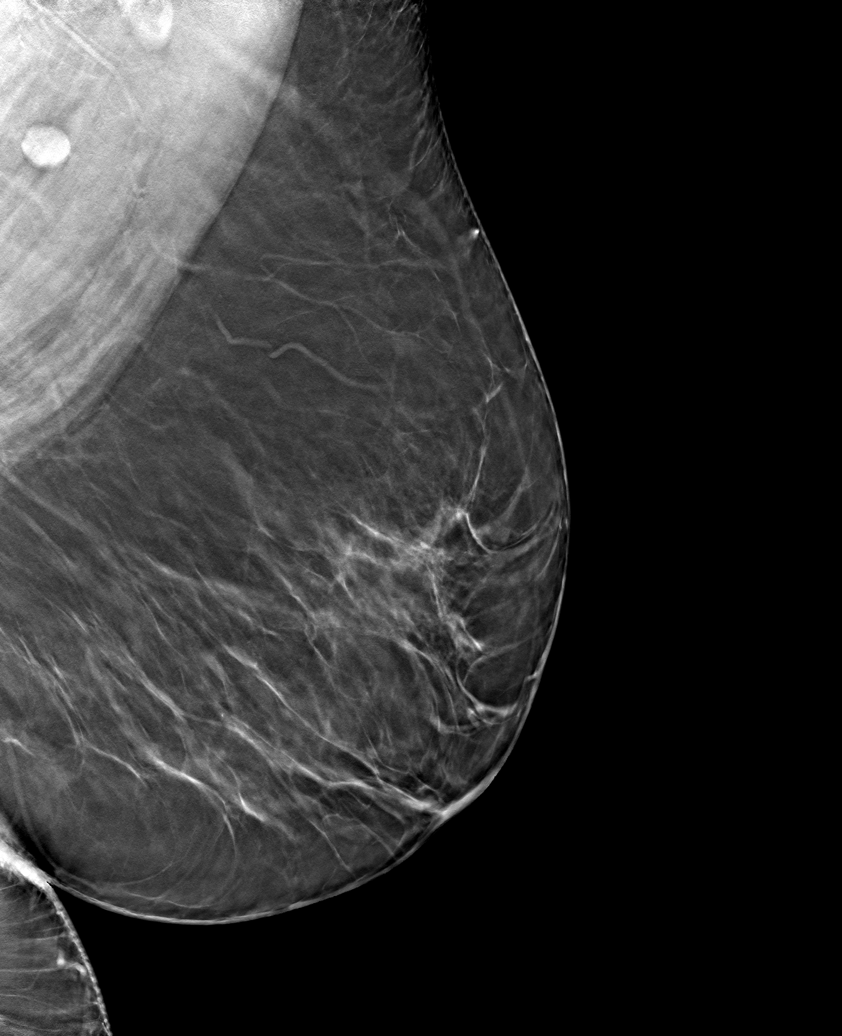

[L MLO tomo (2 of 2) · tomo slice 34/67.0]
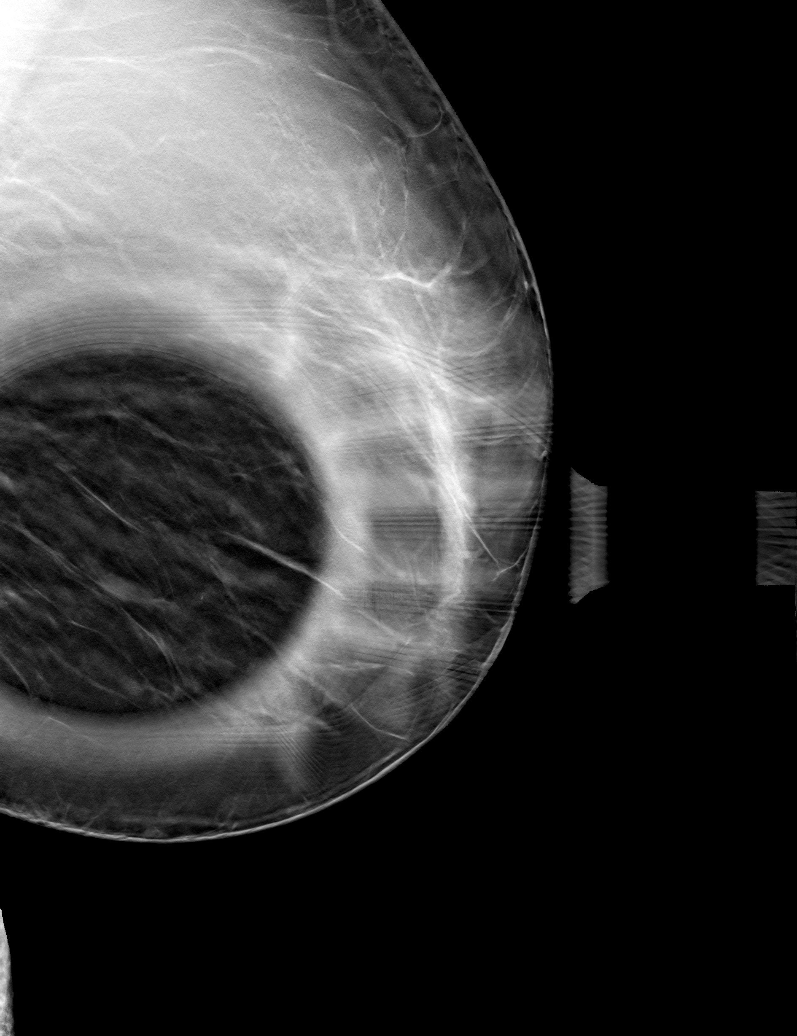

[6 of 18 positions shown; findings below may reference images not displayed]

ACR Breast Density Category b: There are scattered areas of
fibroglandular density.
FINDINGS: The mass questioned in the inferior left breast on the MLO view
resolves both on spot compression tomosynthesis imaging as well as
on the full paddle true lateral tomosynthesis images. No suspicious
calcifications, masses or areas of distortion are seen in the left
breast.

Mammographic images were processed with CAD.
IMPRESSION: Resolution of the left breast questioned mass consistent with
overlapping fibroglandular tissue.

RECOMMENDATION:
Screening mammogram in one year.(Code:2X-A-EXR)

I have discussed the findings and recommendations with the patient.
If applicable, a reminder letter will be sent to the patient
regarding the next appointment.

BI-RADS CATEGORY  1: Negative.

## 2022-07-01 ENCOUNTER — Other Ambulatory Visit: Payer: Self-pay | Admitting: Internal Medicine

## 2022-07-01 DIAGNOSIS — Z1231 Encounter for screening mammogram for malignant neoplasm of breast: Secondary | ICD-10-CM

## 2022-08-11 ENCOUNTER — Ambulatory Visit
Admission: RE | Admit: 2022-08-11 | Discharge: 2022-08-11 | Disposition: A | Discharge status: Home / Self Care | Payer: 59 | Source: Ambulatory Visit | Attending: Internal Medicine | Admitting: Internal Medicine

## 2022-08-11 DIAGNOSIS — Z1231 Encounter for screening mammogram for malignant neoplasm of breast: Secondary | ICD-10-CM | POA: Insufficient documentation

## 2023-02-20 ENCOUNTER — Other Ambulatory Visit: Payer: Self-pay | Admitting: Internal Medicine

## 2023-02-20 DIAGNOSIS — Z1231 Encounter for screening mammogram for malignant neoplasm of breast: Secondary | ICD-10-CM

## 2023-08-12 ENCOUNTER — Ambulatory Visit
Admission: RE | Admit: 2023-08-12 | Discharge: 2023-08-12 | Disposition: A | Discharge status: Home / Self Care | Payer: 59 | Source: Ambulatory Visit | Attending: Internal Medicine | Admitting: Internal Medicine

## 2023-08-12 DIAGNOSIS — Z1231 Encounter for screening mammogram for malignant neoplasm of breast: Secondary | ICD-10-CM | POA: Insufficient documentation
